# Patient Record
Sex: Female | Born: 1955 | Race: White | Hispanic: No | Marital: Single
Health system: Southern US, Community
[De-identification: ages and names within clinical notes are randomized; demographics above are authoritative.]

---

## 1999-11-03 ENCOUNTER — Other Ambulatory Visit: Admission: RE | Admit: 1999-11-03 | Discharge: 1999-11-03 | Payer: Self-pay | Admitting: Family Medicine

## 2001-10-14 ENCOUNTER — Other Ambulatory Visit: Admission: RE | Admit: 2001-10-14 | Discharge: 2001-10-14 | Payer: Self-pay | Admitting: Family Medicine

## 2009-06-22 ENCOUNTER — Ambulatory Visit (HOSPITAL_COMMUNITY): Admission: RE | Admit: 2009-06-22 | Discharge: 2009-06-22 | Payer: Self-pay | Admitting: Radiation Oncology

## 2010-07-18 IMAGING — CT CT NECK W/ CM
4 of 5 series · 16 of 33 positions shown, 19 images · IV contrast (agent unspecified)
Comparison: PET CT done concurrently.

CLINICAL DATA: Left tonsillar fossa squamous cell carcinoma
diagnosed in November 2008.  Restaging post radiation therapy and
chemotherapy.  The patient complains of dysphasia.  There is a
history of carcinoma in situ in the roof of mouth in 9222.

CT NECK WITH CONTRAST
TECHNIQUE: Multidetector CT imaging of the neck was performed with
intravenous contrast.
Contrast: 100 ml 1mnipaque-TWW intravenously.

[Series 2: neck st · axial · 0.35mm/px · z∈[+918,+1024]mm · 3 of 71 slices shown]
[im 18/71  bone]
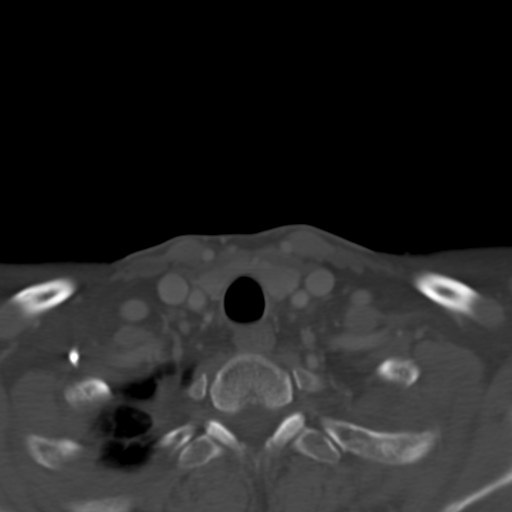
[im 36/71  bone]
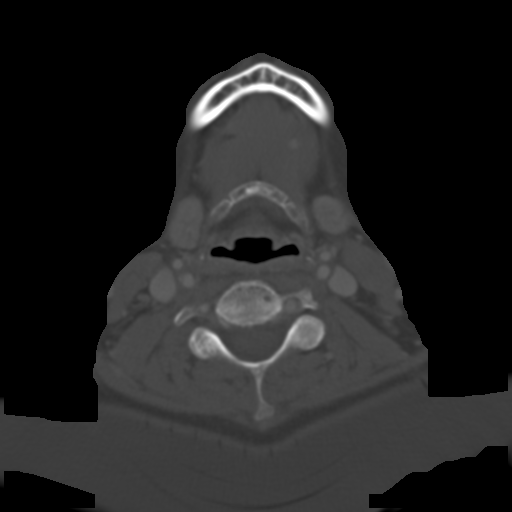
[im 53/71  bone]
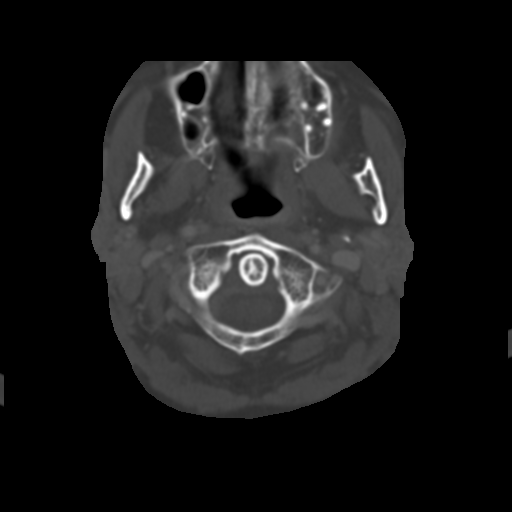

[Series 602: <mpr thick range> · coronal · 0.41mm/px · 3 of 65 slices shown]
[im 14/65  bone]
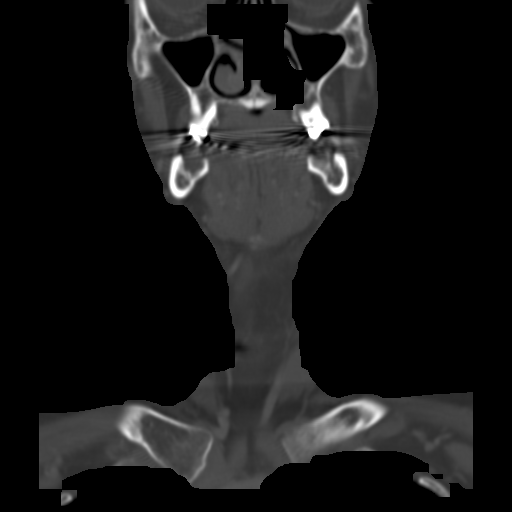
[im 26/65  bone]
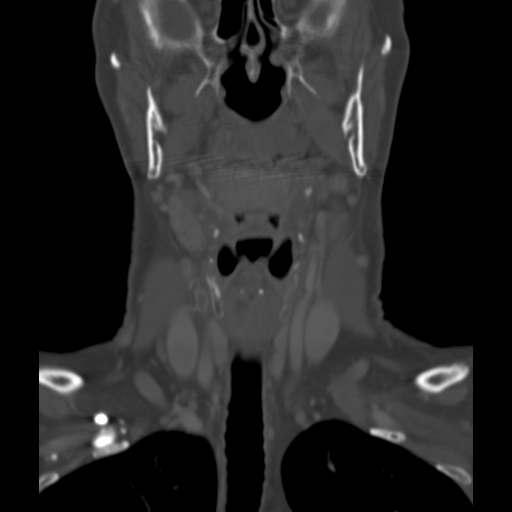
[im 39/65  bone]
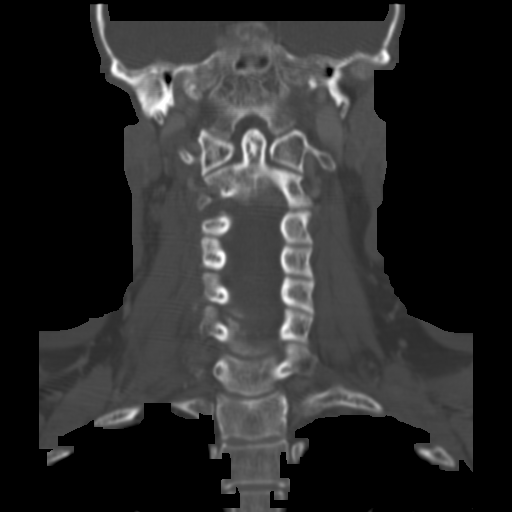

[Series 603: <mpr thick range(1)> · axial · 0.41mm/px · z∈[+855,+982]mm · 5 of 107 slices shown, 7 images]
[im 18/107  soft-tissue]
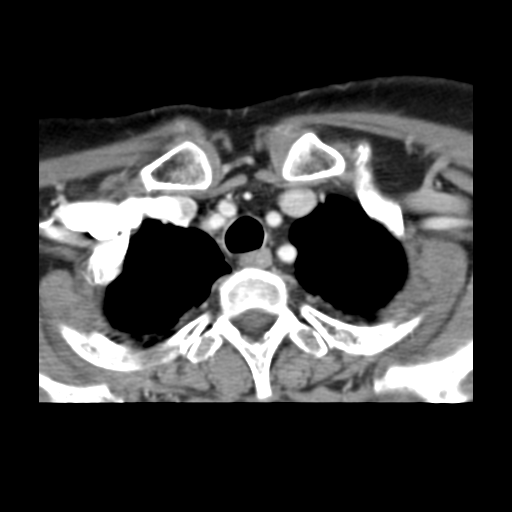
[im 18/107  bone]
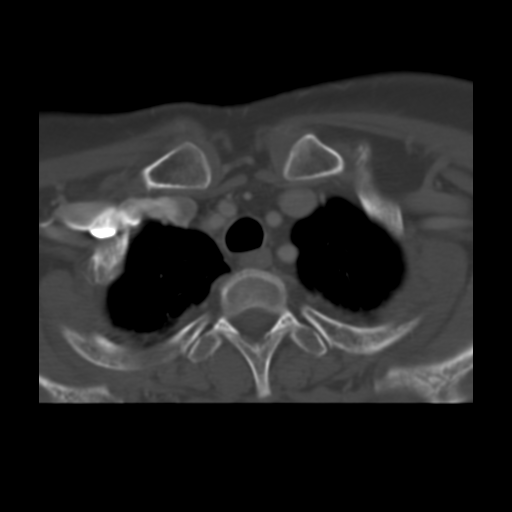
[im 36/107  bone]
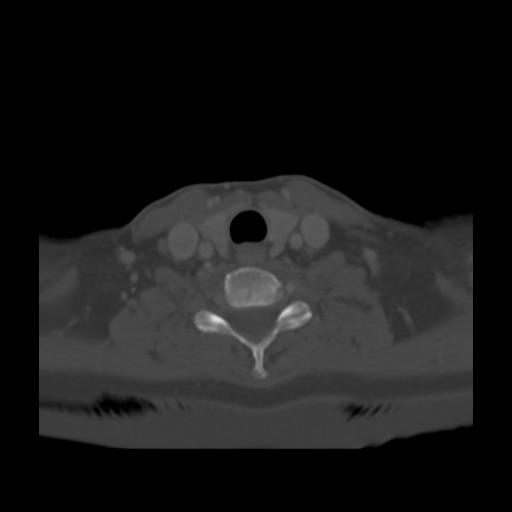
[im 54/107  bone]
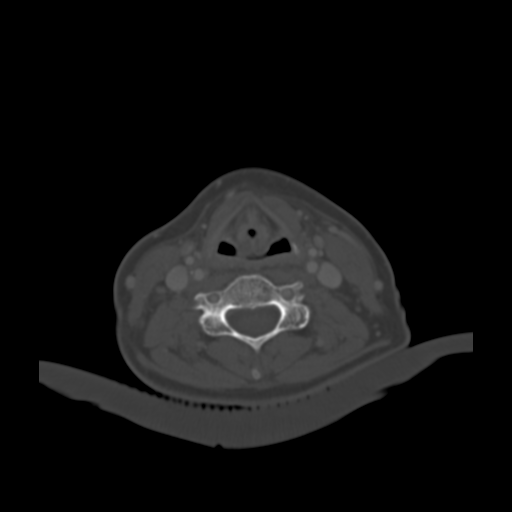
[im 71/107  bone]
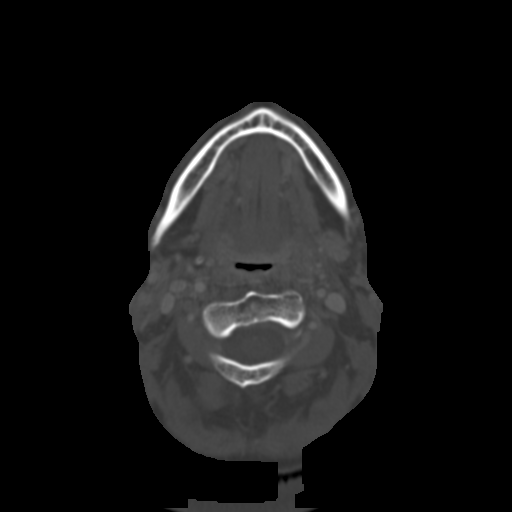
[im 89/107  soft-tissue]
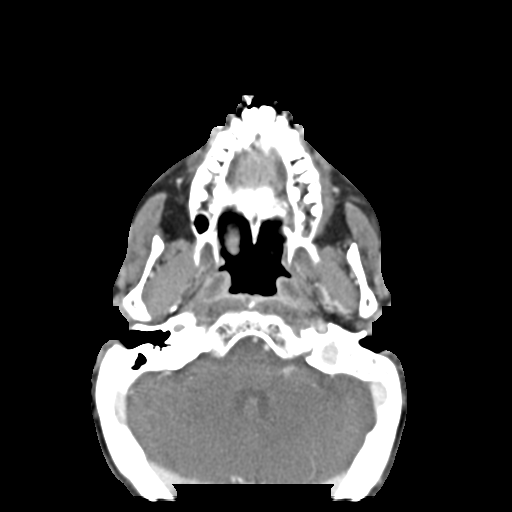
[im 89/107  bone]
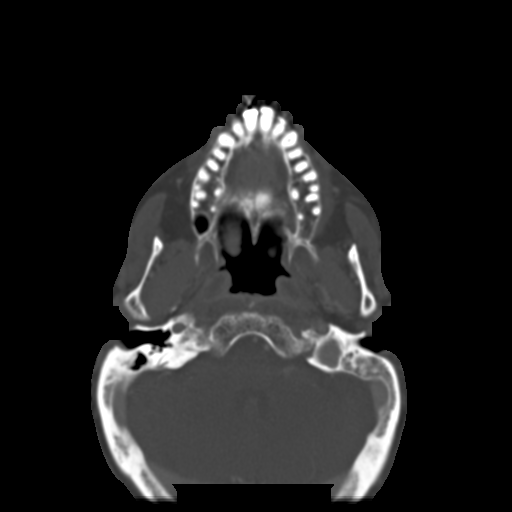

[Series 604: <mpr thick range(2)> · sagittal · 0.41mm/px · 5 of 48 slices shown, 6 images]
[im 16/48  bone]
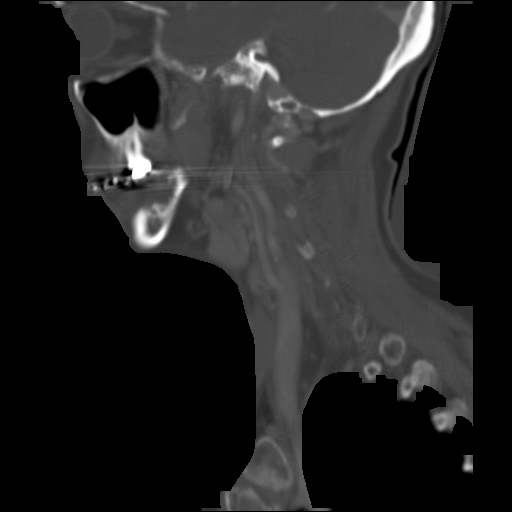
[im 20/48  bone]
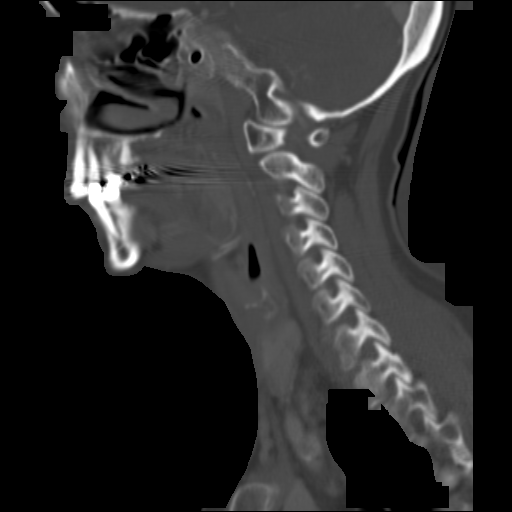
[im 24/48  soft-tissue]
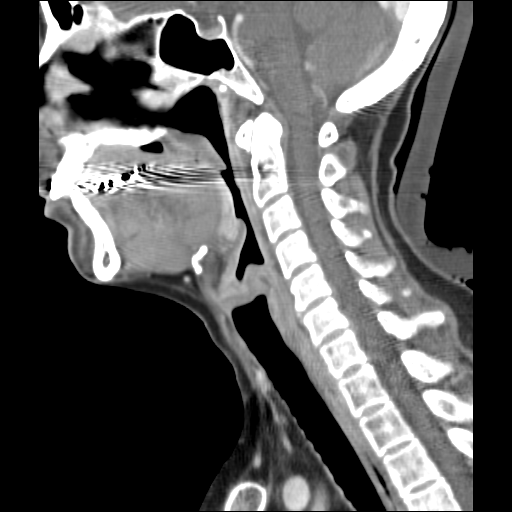
[im 24/48  bone]
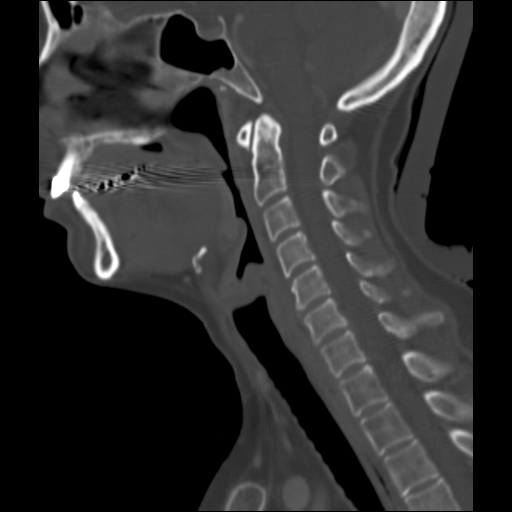
[im 28/48  bone]
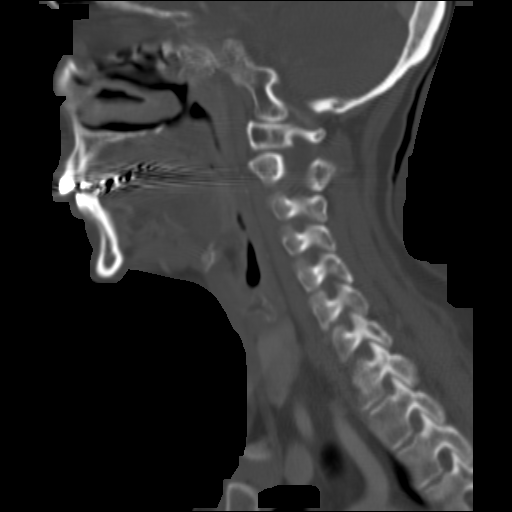
[im 32/48  bone]
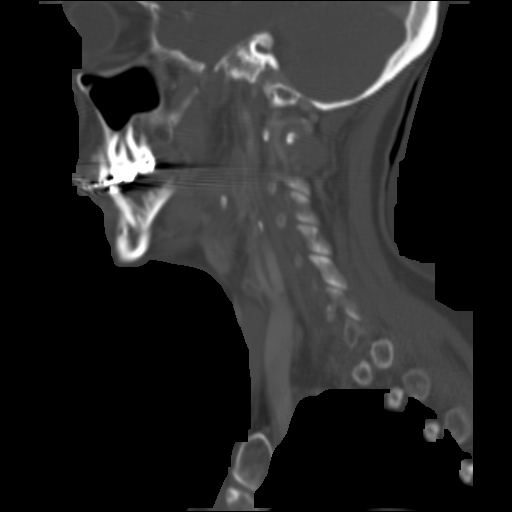

[16 of 33 positions shown; findings below may reference images not displayed]

FINDINGS: There is a level IIA cervical node on the left which
measures 10 x 12 mm on image 30.  This demonstrates central low
density most compatible with necrosis.  No other enlarged or
centrally necrotic lymph nodes are identified within the neck.

No lesions of the pharyngeal mucosal space are identified.  The
palatine tonsils appear symmetric.  There is no obvious abnormality
of the tongue or palate.  The salivary and thyroid glands appear
unremarkable.

The visualized paranasal sinuses are clear.  The visualized
intracranial contents are normal.  There is minimal cervical
spondylosis without focally suspicious lesion.  Mild scarring is
present at the lung apices.  The major vascular structures appear
normal.
IMPRESSION: 1.  Single centrally necrotic level IIA lymph node on the left is
mildly hypermetabolic on today's PET CT.
2.  No other cervical adenopathy.
3.  No lesions of the pharyngeal mucosal space are identified.

## 2011-10-01 ENCOUNTER — Encounter (HOSPITAL_BASED_OUTPATIENT_CLINIC_OR_DEPARTMENT_OTHER): Payer: Medicare Other | Attending: General Surgery

## 2011-10-01 DIAGNOSIS — M278 Other specified diseases of jaws: Secondary | ICD-10-CM | POA: Insufficient documentation

## 2011-10-01 DIAGNOSIS — Z79899 Other long term (current) drug therapy: Secondary | ICD-10-CM | POA: Insufficient documentation

## 2011-10-01 DIAGNOSIS — Y842 Radiological procedure and radiotherapy as the cause of abnormal reaction of the patient, or of later complication, without mention of misadventure at the time of the procedure: Secondary | ICD-10-CM | POA: Insufficient documentation

## 2011-10-01 DIAGNOSIS — I10 Essential (primary) hypertension: Secondary | ICD-10-CM | POA: Insufficient documentation

## 2011-10-02 ENCOUNTER — Other Ambulatory Visit (HOSPITAL_BASED_OUTPATIENT_CLINIC_OR_DEPARTMENT_OTHER): Payer: Self-pay | Admitting: General Surgery

## 2011-10-02 ENCOUNTER — Ambulatory Visit (HOSPITAL_COMMUNITY)
Admission: RE | Admit: 2011-10-02 | Discharge: 2011-10-02 | Disposition: A | Discharge status: Home / Self Care | Payer: Medicare Other | Source: Ambulatory Visit | Attending: General Surgery | Admitting: General Surgery

## 2011-10-02 DIAGNOSIS — C801 Malignant (primary) neoplasm, unspecified: Secondary | ICD-10-CM

## 2011-10-02 DIAGNOSIS — C069 Malignant neoplasm of mouth, unspecified: Secondary | ICD-10-CM | POA: Insufficient documentation

## 2011-10-02 DIAGNOSIS — J449 Chronic obstructive pulmonary disease, unspecified: Secondary | ICD-10-CM | POA: Insufficient documentation

## 2011-10-02 DIAGNOSIS — Z01818 Encounter for other preprocedural examination: Secondary | ICD-10-CM | POA: Insufficient documentation

## 2011-10-02 DIAGNOSIS — J4489 Other specified chronic obstructive pulmonary disease: Secondary | ICD-10-CM | POA: Insufficient documentation

## 2011-10-02 NOTE — H&P (Signed)
Colleen Quinn, TORTI NO.:  [id number]  MEDICAL RECORD NO.:  [id number]  LOCATION:  XRAY                         FACILITY:  [facility]  PHYSICIAN:  Joanne Gavel, M.D.        DATE OF BIRTH:  [birth year]  DATE OF ADMISSION:  10/02/2011 DATE OF DISCHARGE:                             HISTORY & PHYSICAL   CHIEF COMPLAINT:  Osteoradionecrosis of the jaw.  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  This resulted in destruction of many teeth and x-ray proof of osteoradionecrosis.  Prior to removal of the rest of the teeth, our treating physician,  Dr. Clerance Lav has requested hyperbaric oxygen treatments.  PAST MEDICAL HISTORY:  Significant for hypertension and cigarette abuse.  MEDICATIONS:  Nasacort, simvastatin, omeprazole and amlodipine.  ALLERGIES:  CODEINE causes nausea.  PAST SURGICAL HISTORY:  In addition to the feeding tube and the lymphadenectomy, the patient has had knee surgery,  tubal ligation and appendectomy.  Oral cancer is her only serious illness.  PHYSICAL EXAMINATION:  GENERAL:  Awake, alert in no distress. CRANIUM:  Normocephalic. EYES, EARS, NOSE, THROAT:  Normal. MOUTH:  Multiple broken teeth and multiple cavities and gum slough with no open wounds. VITAL SIGNS:  The temperature is 97.3, pulse 92, respirations 18, blood pressure 137/82.  CHEST:  Within normal limits. HEART:  Within normal limits.  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  PLAN:  HBO.    Joanne Gavel, M.D.    RA/MEDQ  D:  10/02/2011  T:  10/02/2011  Job:  [id number]

## 2011-10-29 ENCOUNTER — Encounter (HOSPITAL_BASED_OUTPATIENT_CLINIC_OR_DEPARTMENT_OTHER): Payer: Medicare Other | Attending: General Surgery

## 2011-10-29 DIAGNOSIS — Z79899 Other long term (current) drug therapy: Secondary | ICD-10-CM | POA: Insufficient documentation

## 2011-10-29 DIAGNOSIS — M278 Other specified diseases of jaws: Secondary | ICD-10-CM | POA: Insufficient documentation

## 2011-10-29 DIAGNOSIS — I10 Essential (primary) hypertension: Secondary | ICD-10-CM | POA: Insufficient documentation

## 2011-10-29 DIAGNOSIS — Y842 Radiological procedure and radiotherapy as the cause of abnormal reaction of the patient, or of later complication, without mention of misadventure at the time of the procedure: Secondary | ICD-10-CM | POA: Insufficient documentation

## 2011-11-28 ENCOUNTER — Encounter (HOSPITAL_BASED_OUTPATIENT_CLINIC_OR_DEPARTMENT_OTHER): Payer: Medicare Other | Attending: General Surgery

## 2011-11-28 DIAGNOSIS — M278 Other specified diseases of jaws: Secondary | ICD-10-CM | POA: Insufficient documentation

## 2011-11-28 DIAGNOSIS — Y842 Radiological procedure and radiotherapy as the cause of abnormal reaction of the patient, or of later complication, without mention of misadventure at the time of the procedure: Secondary | ICD-10-CM | POA: Insufficient documentation

## 2012-01-02 ENCOUNTER — Encounter (HOSPITAL_BASED_OUTPATIENT_CLINIC_OR_DEPARTMENT_OTHER): Payer: Medicare Other

## 2015-02-17 DIAGNOSIS — Z87891 Personal history of nicotine dependence: Secondary | ICD-10-CM | POA: Diagnosis not present

## 2015-02-17 DIAGNOSIS — E780 Pure hypercholesterolemia: Secondary | ICD-10-CM | POA: Diagnosis not present

## 2015-02-17 DIAGNOSIS — Z23 Encounter for immunization: Secondary | ICD-10-CM | POA: Diagnosis not present

## 2015-02-17 DIAGNOSIS — K279 Peptic ulcer, site unspecified, unspecified as acute or chronic, without hemorrhage or perforation: Secondary | ICD-10-CM | POA: Diagnosis not present

## 2015-02-17 DIAGNOSIS — E038 Other specified hypothyroidism: Secondary | ICD-10-CM | POA: Diagnosis not present

## 2015-02-17 DIAGNOSIS — R05 Cough: Secondary | ICD-10-CM | POA: Diagnosis not present

## 2015-02-17 DIAGNOSIS — Z0000 Encounter for general adult medical examination without abnormal findings: Secondary | ICD-10-CM | POA: Diagnosis not present

## 2015-02-17 DIAGNOSIS — Z79899 Other long term (current) drug therapy: Secondary | ICD-10-CM | POA: Diagnosis not present

## 2015-02-17 DIAGNOSIS — Z124 Encounter for screening for malignant neoplasm of cervix: Secondary | ICD-10-CM | POA: Diagnosis not present

## 2015-02-25 DIAGNOSIS — E780 Pure hypercholesterolemia: Secondary | ICD-10-CM | POA: Diagnosis not present

## 2015-02-25 DIAGNOSIS — E038 Other specified hypothyroidism: Secondary | ICD-10-CM | POA: Diagnosis not present

## 2015-02-25 DIAGNOSIS — Z79899 Other long term (current) drug therapy: Secondary | ICD-10-CM | POA: Diagnosis not present

## 2015-03-08 DIAGNOSIS — Z87891 Personal history of nicotine dependence: Secondary | ICD-10-CM | POA: Diagnosis not present

## 2015-03-09 DIAGNOSIS — Z87891 Personal history of nicotine dependence: Secondary | ICD-10-CM | POA: Diagnosis not present

## 2015-05-10 DIAGNOSIS — R945 Abnormal results of liver function studies: Secondary | ICD-10-CM | POA: Diagnosis not present

## 2015-05-10 DIAGNOSIS — Z8589 Personal history of malignant neoplasm of other organs and systems: Secondary | ICD-10-CM | POA: Diagnosis not present

## 2015-09-07 DIAGNOSIS — E038 Other specified hypothyroidism: Secondary | ICD-10-CM | POA: Diagnosis not present

## 2015-09-07 DIAGNOSIS — E7800 Pure hypercholesterolemia, unspecified: Secondary | ICD-10-CM | POA: Diagnosis not present

## 2015-09-07 DIAGNOSIS — K279 Peptic ulcer, site unspecified, unspecified as acute or chronic, without hemorrhage or perforation: Secondary | ICD-10-CM | POA: Diagnosis not present

## 2015-09-07 DIAGNOSIS — I10 Essential (primary) hypertension: Secondary | ICD-10-CM | POA: Diagnosis not present

## 2015-09-07 DIAGNOSIS — R636 Underweight: Secondary | ICD-10-CM | POA: Diagnosis not present

## 2015-09-20 DIAGNOSIS — Z0389 Encounter for observation for other suspected diseases and conditions ruled out: Secondary | ICD-10-CM | POA: Diagnosis not present

## 2015-09-20 DIAGNOSIS — Z85818 Personal history of malignant neoplasm of other sites of lip, oral cavity, and pharynx: Secondary | ICD-10-CM | POA: Diagnosis not present

## 2015-09-20 DIAGNOSIS — H608X3 Other otitis externa, bilateral: Secondary | ICD-10-CM | POA: Diagnosis not present

## 2015-10-03 DIAGNOSIS — E038 Other specified hypothyroidism: Secondary | ICD-10-CM | POA: Diagnosis not present

## 2015-10-03 DIAGNOSIS — E7800 Pure hypercholesterolemia, unspecified: Secondary | ICD-10-CM | POA: Diagnosis not present

## 2015-10-13 DIAGNOSIS — I10 Essential (primary) hypertension: Secondary | ICD-10-CM | POA: Diagnosis not present

## 2015-10-29 DIAGNOSIS — S0083XA Contusion of other part of head, initial encounter: Secondary | ICD-10-CM | POA: Diagnosis not present

## 2015-10-29 DIAGNOSIS — R220 Localized swelling, mass and lump, head: Secondary | ICD-10-CM | POA: Diagnosis not present

## 2015-12-16 DIAGNOSIS — Z1231 Encounter for screening mammogram for malignant neoplasm of breast: Secondary | ICD-10-CM | POA: Diagnosis not present

## 2016-05-31 DIAGNOSIS — I700 Atherosclerosis of aorta: Secondary | ICD-10-CM | POA: Diagnosis not present

## 2016-05-31 DIAGNOSIS — I2510 Atherosclerotic heart disease of native coronary artery without angina pectoris: Secondary | ICD-10-CM | POA: Diagnosis not present

## 2016-05-31 DIAGNOSIS — K760 Fatty (change of) liver, not elsewhere classified: Secondary | ICD-10-CM | POA: Diagnosis not present

## 2016-05-31 DIAGNOSIS — Z87891 Personal history of nicotine dependence: Secondary | ICD-10-CM | POA: Diagnosis not present

## 2016-05-31 DIAGNOSIS — Z122 Encounter for screening for malignant neoplasm of respiratory organs: Secondary | ICD-10-CM | POA: Diagnosis not present

## 2016-06-11 DIAGNOSIS — K279 Peptic ulcer, site unspecified, unspecified as acute or chronic, without hemorrhage or perforation: Secondary | ICD-10-CM | POA: Diagnosis not present

## 2016-06-11 DIAGNOSIS — J449 Chronic obstructive pulmonary disease, unspecified: Secondary | ICD-10-CM | POA: Diagnosis not present

## 2016-06-11 DIAGNOSIS — M25561 Pain in right knee: Secondary | ICD-10-CM | POA: Diagnosis not present

## 2016-06-11 DIAGNOSIS — E038 Other specified hypothyroidism: Secondary | ICD-10-CM | POA: Diagnosis not present

## 2016-06-11 DIAGNOSIS — Z79899 Other long term (current) drug therapy: Secondary | ICD-10-CM | POA: Diagnosis not present

## 2016-06-11 DIAGNOSIS — E7800 Pure hypercholesterolemia, unspecified: Secondary | ICD-10-CM | POA: Diagnosis not present

## 2016-06-11 DIAGNOSIS — I10 Essential (primary) hypertension: Secondary | ICD-10-CM | POA: Diagnosis not present

## 2016-07-03 DIAGNOSIS — Z79899 Other long term (current) drug therapy: Secondary | ICD-10-CM | POA: Diagnosis not present

## 2016-07-03 DIAGNOSIS — E7800 Pure hypercholesterolemia, unspecified: Secondary | ICD-10-CM | POA: Diagnosis not present

## 2016-07-03 DIAGNOSIS — E038 Other specified hypothyroidism: Secondary | ICD-10-CM | POA: Diagnosis not present

## 2016-09-13 DIAGNOSIS — R636 Underweight: Secondary | ICD-10-CM | POA: Diagnosis not present

## 2016-09-13 DIAGNOSIS — Z0000 Encounter for general adult medical examination without abnormal findings: Secondary | ICD-10-CM | POA: Diagnosis not present

## 2016-09-13 DIAGNOSIS — Z1239 Encounter for other screening for malignant neoplasm of breast: Secondary | ICD-10-CM | POA: Diagnosis not present

## 2016-09-13 DIAGNOSIS — Z23 Encounter for immunization: Secondary | ICD-10-CM | POA: Diagnosis not present

## 2016-09-13 DIAGNOSIS — Z681 Body mass index (BMI) 19 or less, adult: Secondary | ICD-10-CM | POA: Diagnosis not present

## 2016-09-13 DIAGNOSIS — M810 Age-related osteoporosis without current pathological fracture: Secondary | ICD-10-CM | POA: Diagnosis not present

## 2016-09-13 DIAGNOSIS — J449 Chronic obstructive pulmonary disease, unspecified: Secondary | ICD-10-CM | POA: Diagnosis not present

## 2016-12-17 DIAGNOSIS — Z1231 Encounter for screening mammogram for malignant neoplasm of breast: Secondary | ICD-10-CM | POA: Diagnosis not present

## 2016-12-17 DIAGNOSIS — M810 Age-related osteoporosis without current pathological fracture: Secondary | ICD-10-CM | POA: Diagnosis not present

## 2016-12-20 DIAGNOSIS — R636 Underweight: Secondary | ICD-10-CM | POA: Diagnosis not present

## 2016-12-20 DIAGNOSIS — I10 Essential (primary) hypertension: Secondary | ICD-10-CM | POA: Diagnosis not present

## 2016-12-20 DIAGNOSIS — E7800 Pure hypercholesterolemia, unspecified: Secondary | ICD-10-CM | POA: Diagnosis not present

## 2016-12-20 DIAGNOSIS — M810 Age-related osteoporosis without current pathological fracture: Secondary | ICD-10-CM | POA: Diagnosis not present

## 2016-12-20 DIAGNOSIS — J449 Chronic obstructive pulmonary disease, unspecified: Secondary | ICD-10-CM | POA: Diagnosis not present

## 2016-12-20 DIAGNOSIS — Z79899 Other long term (current) drug therapy: Secondary | ICD-10-CM | POA: Diagnosis not present

## 2016-12-20 DIAGNOSIS — E038 Other specified hypothyroidism: Secondary | ICD-10-CM | POA: Diagnosis not present

## 2017-01-03 DIAGNOSIS — E7800 Pure hypercholesterolemia, unspecified: Secondary | ICD-10-CM | POA: Diagnosis not present

## 2017-01-03 DIAGNOSIS — Z79899 Other long term (current) drug therapy: Secondary | ICD-10-CM | POA: Diagnosis not present

## 2017-01-03 DIAGNOSIS — E038 Other specified hypothyroidism: Secondary | ICD-10-CM | POA: Diagnosis not present

## 2017-01-03 DIAGNOSIS — R922 Inconclusive mammogram: Secondary | ICD-10-CM | POA: Diagnosis not present

## 2017-01-03 DIAGNOSIS — R928 Other abnormal and inconclusive findings on diagnostic imaging of breast: Secondary | ICD-10-CM | POA: Diagnosis not present

## 2017-04-18 ENCOUNTER — Other Ambulatory Visit: Payer: Self-pay | Admitting: Physician Assistant

## 2017-06-06 DIAGNOSIS — J439 Emphysema, unspecified: Secondary | ICD-10-CM | POA: Diagnosis not present

## 2017-06-06 DIAGNOSIS — I700 Atherosclerosis of aorta: Secondary | ICD-10-CM | POA: Diagnosis not present

## 2017-06-06 DIAGNOSIS — Z122 Encounter for screening for malignant neoplasm of respiratory organs: Secondary | ICD-10-CM | POA: Diagnosis not present

## 2017-06-06 DIAGNOSIS — Z87891 Personal history of nicotine dependence: Secondary | ICD-10-CM | POA: Diagnosis not present

## 2017-06-13 DIAGNOSIS — Z79899 Other long term (current) drug therapy: Secondary | ICD-10-CM | POA: Diagnosis not present

## 2017-06-13 DIAGNOSIS — E7800 Pure hypercholesterolemia, unspecified: Secondary | ICD-10-CM | POA: Diagnosis not present

## 2017-06-13 DIAGNOSIS — E038 Other specified hypothyroidism: Secondary | ICD-10-CM | POA: Diagnosis not present

## 2017-06-19 DIAGNOSIS — E7800 Pure hypercholesterolemia, unspecified: Secondary | ICD-10-CM | POA: Diagnosis not present

## 2017-06-19 DIAGNOSIS — M810 Age-related osteoporosis without current pathological fracture: Secondary | ICD-10-CM | POA: Diagnosis not present

## 2017-06-19 DIAGNOSIS — J449 Chronic obstructive pulmonary disease, unspecified: Secondary | ICD-10-CM | POA: Diagnosis not present

## 2017-06-19 DIAGNOSIS — I10 Essential (primary) hypertension: Secondary | ICD-10-CM | POA: Diagnosis not present

## 2017-06-19 DIAGNOSIS — E038 Other specified hypothyroidism: Secondary | ICD-10-CM | POA: Diagnosis not present

## 2017-06-19 DIAGNOSIS — I2584 Coronary atherosclerosis due to calcified coronary lesion: Secondary | ICD-10-CM | POA: Diagnosis not present

## 2017-10-10 DIAGNOSIS — E7800 Pure hypercholesterolemia, unspecified: Secondary | ICD-10-CM | POA: Diagnosis not present

## 2017-10-10 DIAGNOSIS — Z0000 Encounter for general adult medical examination without abnormal findings: Secondary | ICD-10-CM | POA: Diagnosis not present

## 2017-10-10 DIAGNOSIS — E038 Other specified hypothyroidism: Secondary | ICD-10-CM | POA: Diagnosis not present

## 2017-10-10 DIAGNOSIS — K279 Peptic ulcer, site unspecified, unspecified as acute or chronic, without hemorrhage or perforation: Secondary | ICD-10-CM | POA: Diagnosis not present

## 2017-10-10 DIAGNOSIS — Z681 Body mass index (BMI) 19 or less, adult: Secondary | ICD-10-CM | POA: Diagnosis not present

## 2017-10-10 DIAGNOSIS — R636 Underweight: Secondary | ICD-10-CM | POA: Diagnosis not present

## 2017-10-10 DIAGNOSIS — I10 Essential (primary) hypertension: Secondary | ICD-10-CM | POA: Diagnosis not present

## 2017-10-10 DIAGNOSIS — J449 Chronic obstructive pulmonary disease, unspecified: Secondary | ICD-10-CM | POA: Diagnosis not present

## 2017-12-09 DIAGNOSIS — J0100 Acute maxillary sinusitis, unspecified: Secondary | ICD-10-CM | POA: Diagnosis not present

## 2017-12-18 DIAGNOSIS — J0100 Acute maxillary sinusitis, unspecified: Secondary | ICD-10-CM | POA: Diagnosis not present

## 2018-01-06 DIAGNOSIS — Z1231 Encounter for screening mammogram for malignant neoplasm of breast: Secondary | ICD-10-CM | POA: Diagnosis not present

## 2018-02-13 DIAGNOSIS — R87810 Cervical high risk human papillomavirus (HPV) DNA test positive: Secondary | ICD-10-CM | POA: Diagnosis not present

## 2018-02-13 DIAGNOSIS — Z1151 Encounter for screening for human papillomavirus (HPV): Secondary | ICD-10-CM | POA: Diagnosis not present

## 2018-02-13 DIAGNOSIS — Z124 Encounter for screening for malignant neoplasm of cervix: Secondary | ICD-10-CM | POA: Diagnosis not present

## 2018-02-13 DIAGNOSIS — J329 Chronic sinusitis, unspecified: Secondary | ICD-10-CM | POA: Diagnosis not present

## 2018-02-13 DIAGNOSIS — J449 Chronic obstructive pulmonary disease, unspecified: Secondary | ICD-10-CM | POA: Diagnosis not present

## 2018-02-13 DIAGNOSIS — I10 Essential (primary) hypertension: Secondary | ICD-10-CM | POA: Diagnosis not present

## 2018-02-13 DIAGNOSIS — E038 Other specified hypothyroidism: Secondary | ICD-10-CM | POA: Diagnosis not present

## 2018-02-13 DIAGNOSIS — E7800 Pure hypercholesterolemia, unspecified: Secondary | ICD-10-CM | POA: Diagnosis not present

## 2018-02-17 DIAGNOSIS — J320 Chronic maxillary sinusitis: Secondary | ICD-10-CM | POA: Diagnosis not present

## 2018-02-17 DIAGNOSIS — J329 Chronic sinusitis, unspecified: Secondary | ICD-10-CM | POA: Diagnosis not present

## 2018-03-26 DIAGNOSIS — J439 Emphysema, unspecified: Secondary | ICD-10-CM | POA: Diagnosis not present

## 2018-03-26 DIAGNOSIS — J029 Acute pharyngitis, unspecified: Secondary | ICD-10-CM | POA: Diagnosis not present

## 2018-03-31 DIAGNOSIS — F17200 Nicotine dependence, unspecified, uncomplicated: Secondary | ICD-10-CM | POA: Diagnosis not present

## 2018-03-31 DIAGNOSIS — D490 Neoplasm of unspecified behavior of digestive system: Secondary | ICD-10-CM | POA: Diagnosis not present

## 2018-03-31 DIAGNOSIS — R070 Pain in throat: Secondary | ICD-10-CM | POA: Diagnosis not present

## 2018-03-31 DIAGNOSIS — Z923 Personal history of irradiation: Secondary | ICD-10-CM | POA: Diagnosis not present

## 2018-03-31 DIAGNOSIS — Z9221 Personal history of antineoplastic chemotherapy: Secondary | ICD-10-CM | POA: Diagnosis not present

## 2018-04-02 DIAGNOSIS — Z923 Personal history of irradiation: Secondary | ICD-10-CM | POA: Diagnosis not present

## 2018-04-02 DIAGNOSIS — Z85818 Personal history of malignant neoplasm of other sites of lip, oral cavity, and pharynx: Secondary | ICD-10-CM | POA: Diagnosis not present

## 2018-04-02 DIAGNOSIS — J392 Other diseases of pharynx: Secondary | ICD-10-CM | POA: Diagnosis not present

## 2018-04-02 DIAGNOSIS — Z720 Tobacco use: Secondary | ICD-10-CM | POA: Diagnosis not present

## 2018-04-02 DIAGNOSIS — I6523 Occlusion and stenosis of bilateral carotid arteries: Secondary | ICD-10-CM | POA: Diagnosis not present

## 2018-04-07 ENCOUNTER — Telehealth (HOSPITAL_COMMUNITY): Payer: Self-pay

## 2018-04-07 ENCOUNTER — Other Ambulatory Visit (HOSPITAL_COMMUNITY): Payer: Self-pay | Admitting: Interventional Radiology

## 2018-04-07 DIAGNOSIS — I6523 Occlusion and stenosis of bilateral carotid arteries: Secondary | ICD-10-CM

## 2018-04-07 NOTE — Telephone Encounter (Signed)
Called twice to schedule consult, no answer, no vm. AW

## 2018-04-08 ENCOUNTER — Ambulatory Visit (HOSPITAL_COMMUNITY): Payer: Medicare HMO | Attending: Interventional Radiology

## 2018-04-08 ENCOUNTER — Encounter (HOSPITAL_COMMUNITY): Payer: Self-pay

## 2018-04-09 ENCOUNTER — Telehealth (HOSPITAL_COMMUNITY): Payer: Self-pay | Admitting: Radiology

## 2018-04-09 NOTE — Telephone Encounter (Signed)
Called pt to try to reschedule consult. No answer at any of her available phone numbers or her emergency contact number. JM

## 2018-04-26 DEATH — deceased
